# Patient Record
Sex: Female | Born: 1935 | Race: White | Hispanic: No
Health system: Southern US, Community
[De-identification: ages and names within clinical notes are randomized; demographics above are authoritative.]

---

## 1998-07-26 ENCOUNTER — Ambulatory Visit (HOSPITAL_COMMUNITY): Admission: RE | Admit: 1998-07-26 | Discharge: 1998-07-26 | Payer: Self-pay | Admitting: *Deleted

## 2000-02-04 ENCOUNTER — Ambulatory Visit (HOSPITAL_COMMUNITY): Admission: RE | Admit: 2000-02-04 | Discharge: 2000-02-04 | Payer: Self-pay | Admitting: *Deleted

## 2000-03-15 ENCOUNTER — Other Ambulatory Visit: Admission: RE | Admit: 2000-03-15 | Discharge: 2000-03-15 | Payer: Self-pay | Admitting: *Deleted

## 2000-05-14 ENCOUNTER — Encounter (INDEPENDENT_AMBULATORY_CARE_PROVIDER_SITE_OTHER): Payer: Self-pay

## 2000-05-14 ENCOUNTER — Other Ambulatory Visit: Admission: RE | Admit: 2000-05-14 | Discharge: 2000-05-14 | Payer: Self-pay | Admitting: *Deleted

## 2001-03-17 ENCOUNTER — Other Ambulatory Visit: Admission: RE | Admit: 2001-03-17 | Discharge: 2001-03-17 | Payer: Self-pay | Admitting: *Deleted

## 2001-03-22 ENCOUNTER — Encounter: Admission: RE | Admit: 2001-03-22 | Discharge: 2001-03-22 | Payer: Self-pay | Admitting: *Deleted

## 2001-08-12 ENCOUNTER — Encounter: Payer: Self-pay | Admitting: Internal Medicine

## 2001-08-12 ENCOUNTER — Encounter: Admission: RE | Admit: 2001-08-12 | Discharge: 2001-08-12 | Payer: Self-pay | Admitting: Internal Medicine

## 2003-01-26 ENCOUNTER — Encounter: Payer: Self-pay | Admitting: Orthopedic Surgery

## 2003-01-31 ENCOUNTER — Encounter: Payer: Self-pay | Admitting: Orthopedic Surgery

## 2003-01-31 ENCOUNTER — Inpatient Hospital Stay (HOSPITAL_COMMUNITY): Admission: RE | Admit: 2003-01-31 | Discharge: 2003-02-04 | Payer: Self-pay | Admitting: Orthopedic Surgery

## 2004-08-19 ENCOUNTER — Encounter: Admission: RE | Admit: 2004-08-19 | Discharge: 2004-08-19 | Payer: Self-pay | Admitting: Orthopedic Surgery

## 2004-09-03 ENCOUNTER — Encounter: Admission: RE | Admit: 2004-09-03 | Discharge: 2004-09-03 | Payer: Self-pay | Admitting: Orthopedic Surgery

## 2005-08-24 ENCOUNTER — Other Ambulatory Visit: Admission: RE | Admit: 2005-08-24 | Discharge: 2005-08-24 | Payer: Self-pay | Admitting: Internal Medicine

## 2005-11-18 ENCOUNTER — Encounter: Admission: RE | Admit: 2005-11-18 | Discharge: 2005-11-18 | Payer: Self-pay | Admitting: Family Medicine

## 2005-12-02 ENCOUNTER — Encounter: Admission: RE | Admit: 2005-12-02 | Discharge: 2005-12-02 | Payer: Self-pay | Admitting: Family Medicine

## 2005-12-10 ENCOUNTER — Ambulatory Visit: Payer: Self-pay | Admitting: Physical Medicine & Rehabilitation

## 2005-12-10 ENCOUNTER — Inpatient Hospital Stay (HOSPITAL_COMMUNITY): Admission: RE | Admit: 2005-12-10 | Discharge: 2005-12-14 | Payer: Self-pay | Admitting: Orthopedic Surgery

## 2006-03-24 ENCOUNTER — Encounter: Admission: RE | Admit: 2006-03-24 | Discharge: 2006-03-24 | Payer: Self-pay | Admitting: Family Medicine

## 2009-03-18 ENCOUNTER — Encounter: Admission: RE | Admit: 2009-03-18 | Discharge: 2009-03-18 | Payer: Self-pay | Admitting: Internal Medicine

## 2009-03-25 ENCOUNTER — Ambulatory Visit (HOSPITAL_COMMUNITY): Admission: RE | Admit: 2009-03-25 | Discharge: 2009-03-25 | Payer: Self-pay | Admitting: *Deleted

## 2009-04-02 ENCOUNTER — Ambulatory Visit (HOSPITAL_COMMUNITY): Admission: RE | Admit: 2009-04-02 | Discharge: 2009-04-02 | Payer: Self-pay | Admitting: Internal Medicine

## 2009-04-04 ENCOUNTER — Encounter: Admission: RE | Admit: 2009-04-04 | Discharge: 2009-04-04 | Payer: Self-pay | Admitting: *Deleted

## 2009-04-15 ENCOUNTER — Ambulatory Visit (HOSPITAL_COMMUNITY): Admission: RE | Admit: 2009-04-15 | Discharge: 2009-04-15 | Payer: Self-pay | Admitting: *Deleted

## 2009-05-21 ENCOUNTER — Encounter: Admission: RE | Admit: 2009-05-21 | Discharge: 2009-05-21 | Payer: Self-pay | Admitting: *Deleted

## 2010-06-03 ENCOUNTER — Encounter: Admission: RE | Admit: 2010-06-03 | Discharge: 2010-06-26 | Payer: Self-pay | Admitting: Diagnostic Neuroimaging

## 2010-09-13 IMAGING — CT CT PELVIS W/ CM
3 of 6 series · 14 of 32 positions shown, 19 images · IV contrast (30CC OMNI 350 & [ID] OMNI 300)
Comparison: None.

CT ABDOMEN

CLINICAL DATA: Diffuse abdominal pain.  Right upper quadrant pain.
Nausea and constipation.

CT ABDOMEN AND PELVIS WITH CONTRAST
TECHNIQUE: Multidetector CT imaging of the abdomen and pelvis was
performed using the standard protocol following bolus
administration of intravenous contrast.
Contrast: 100 ml [id number]

[Series 2: abdomen w/ · axial · 0.76mm/px · z∈[-290,-66]mm · 3 of 91 slices shown (1 of 2)]
[im 23/91  soft-tissue]
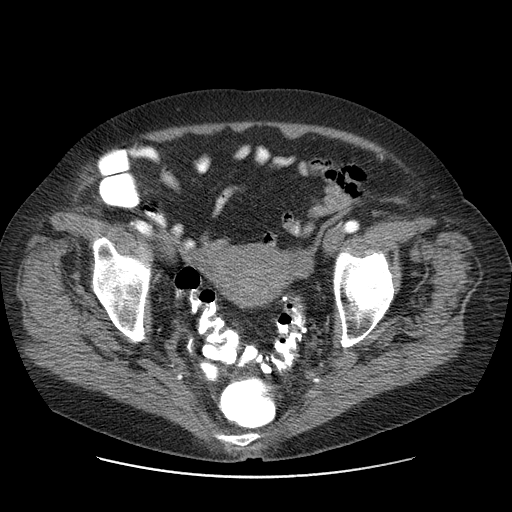
[im 46/91  soft-tissue]
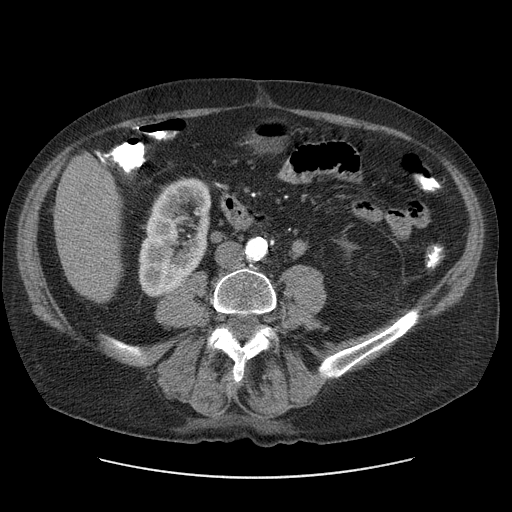
[im 68/91  soft-tissue]
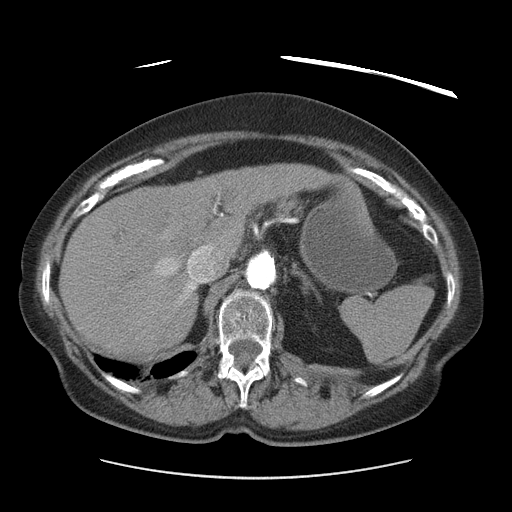

[Series 5: abdomen w/ · axial · 0.76mm/px · z∈[-316,-46]mm · 4 of 92 slices shown, 9 images (2 of 2)]
[im 19/92  soft-tissue]
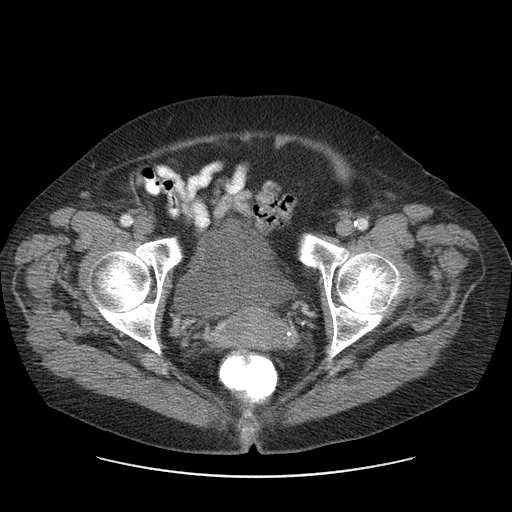
[im 19/92  lung]
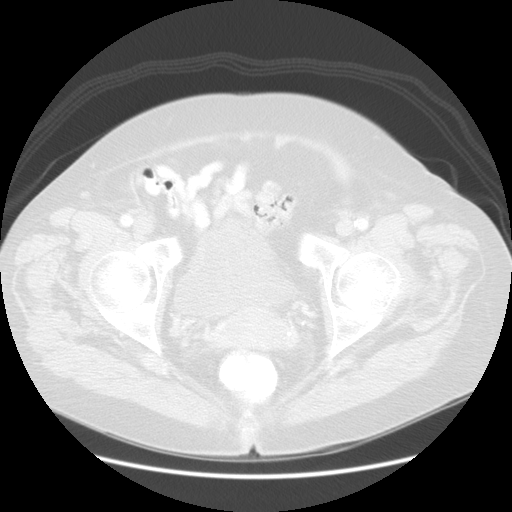
[im 19/92  bone]
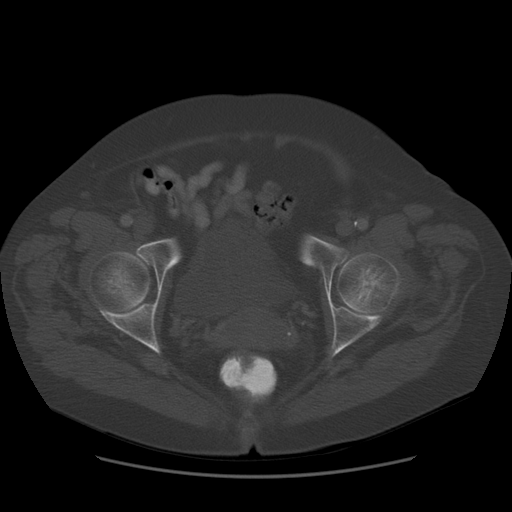
[im 37/92  soft-tissue]
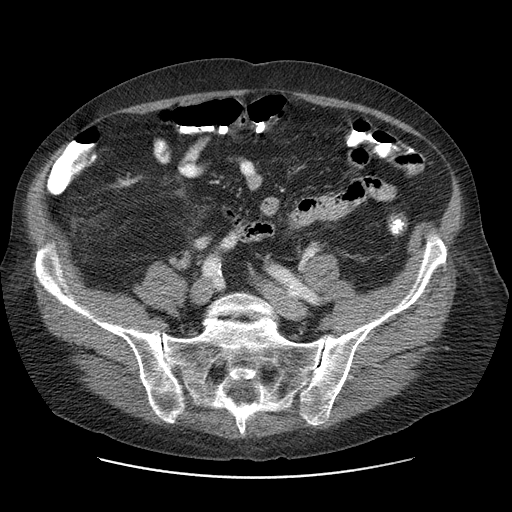
[im 37/92  lung]
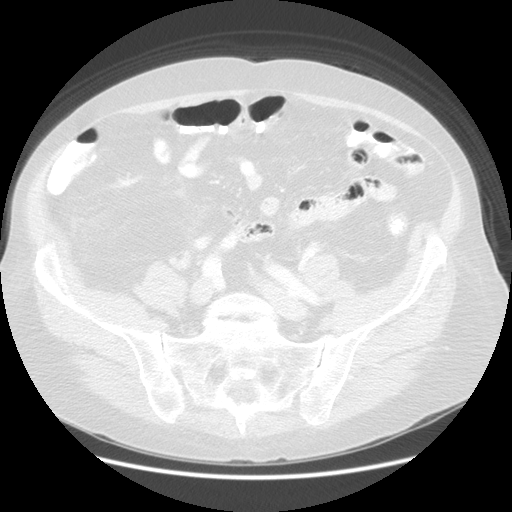
[im 55/92  soft-tissue]
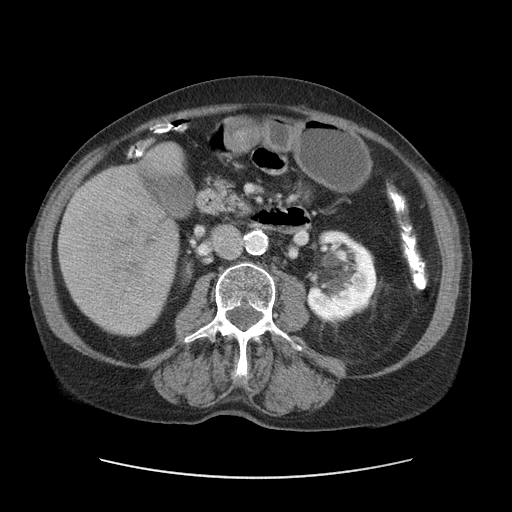
[im 55/92  lung]
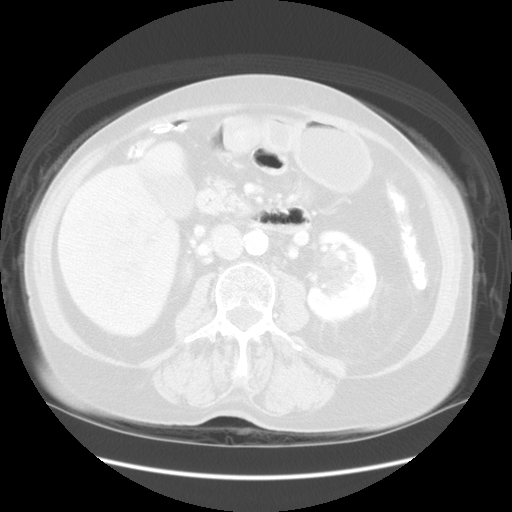
[im 73/92  soft-tissue]
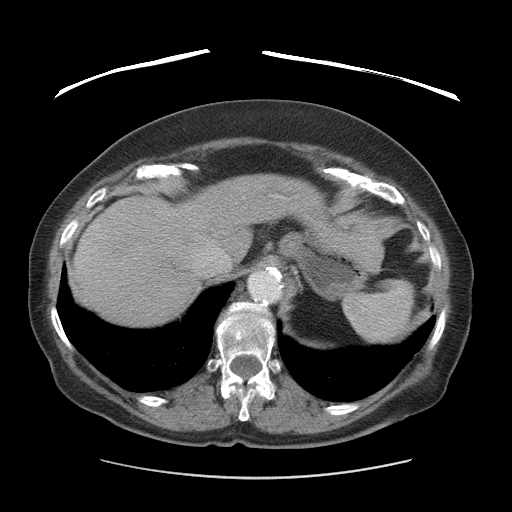
[im 73/92  lung]
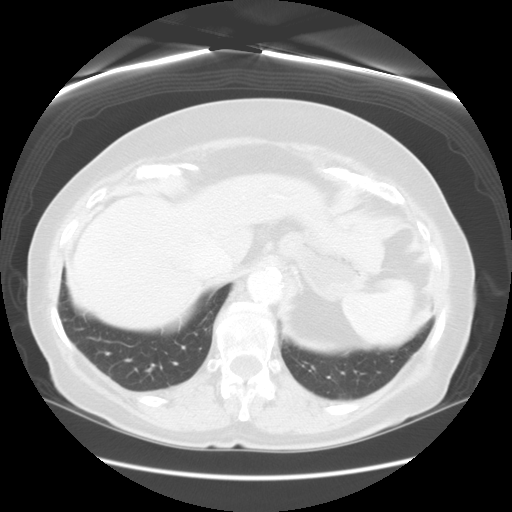

[Series 701: sagittal · sagittal · 0.94mm/px · 7 of 156 slices shown]
[im 20/156  soft-tissue]
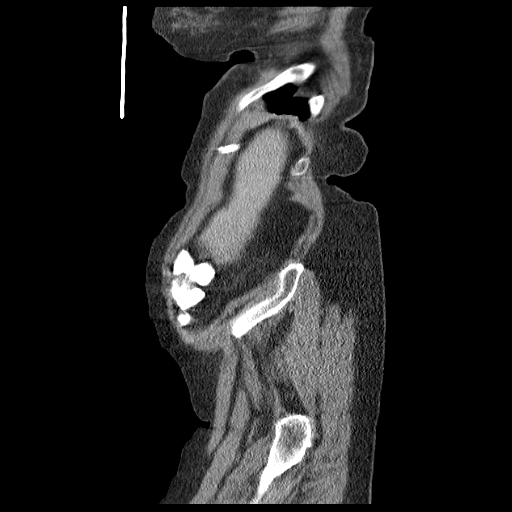
[im 39/156  soft-tissue]
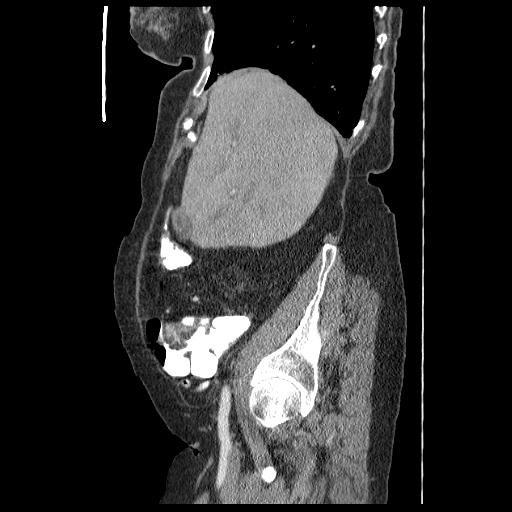
[im 59/156  soft-tissue]
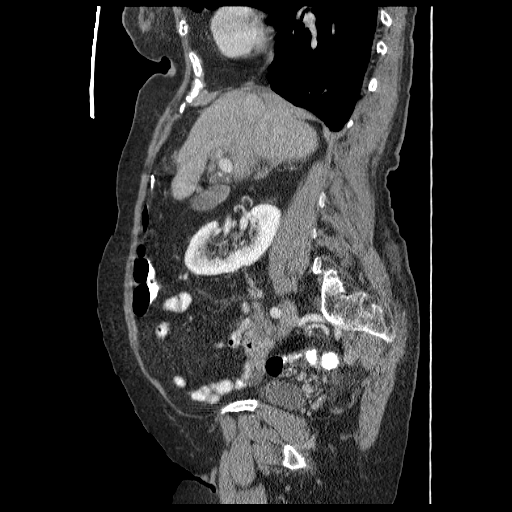
[im 78/156  soft-tissue]
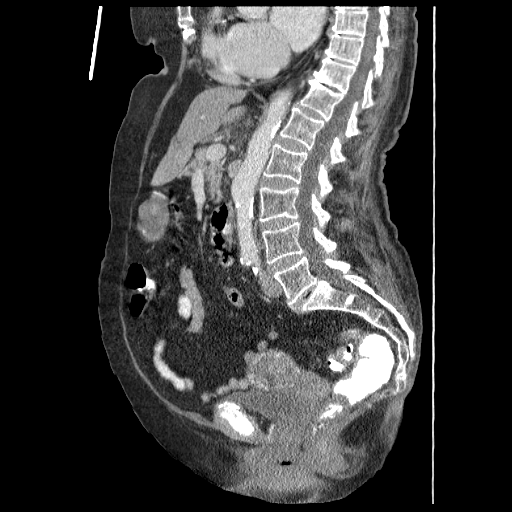
[im 97/156  soft-tissue]
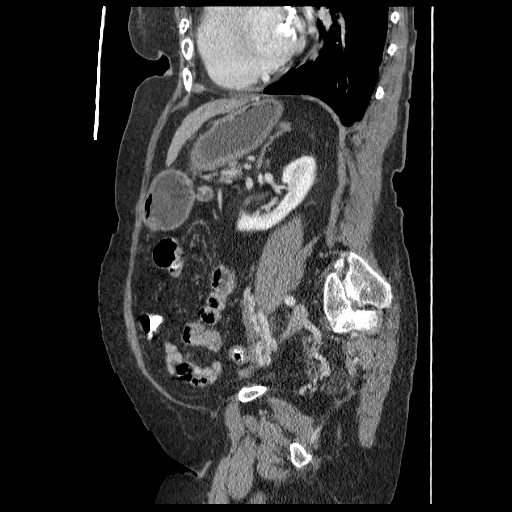
[im 117/156  soft-tissue]
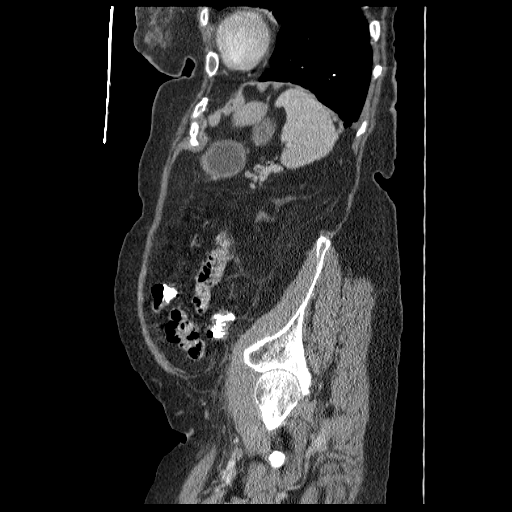
[im 136/156  soft-tissue]
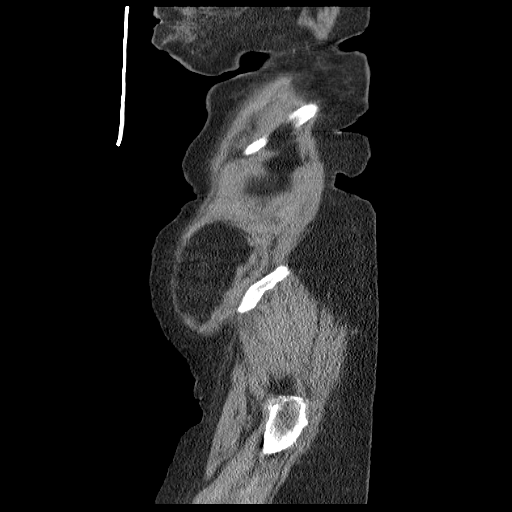

[14 of 32 positions shown; findings below may reference images not displayed]

FINDINGS: Lung bases show mild scarring.  Heart is enlarged.  No
pericardial or pleural effusion.

Mild periportal edema.  Liver, gallbladder and adrenal glands are
unremarkable.  Right kidney appears non rotated.  There are left
renal sinus cysts.  Spleen and pancreas are unremarkable.  There
may be soft tissue at the orifice of the pyloric channel (series 5,
image 41).  Small bowel is unremarkable.  Atherosclerotic
calcification of the arterial vasculature.  No pathologically
enlarged lymph nodes.
IMPRESSION: Question soft tissue at the orifice of the pyloric channel.  Upper
GI series or endoscopy may be helpful in further evaluation, as
clinically indicated.

CT PELVIS
FINDINGS: Calcified lesions in the uterus are likely fibroids.
Colon unremarkable.  No pathologically enlarged lymph nodes.  Trace
free fluid.  No worrisome lytic or sclerotic lesions.  Pars defects
are seen at L5, with minimal grade 1 anterolisthesis of L5 on S1.
IMPRESSION: No acute findings in the pelvis.

## 2010-11-22 ENCOUNTER — Encounter: Payer: Self-pay | Admitting: Orthopedic Surgery

## 2011-03-17 NOTE — Op Note (Signed)
NAME:  Sierra Robinson, Sierra Robinson              ACCOUNT NO.:  [id number]   MEDICAL RECORD NO.:  [id number]          PATIENT TYPE:  AMB   LOCATION:  ENDO                         FACILITY:  [facility]   PHYSICIAN:  Georgiana Spinner, M.D.    DATE OF BIRTH:  13-Oct-1919   DATE OF PROCEDURE:  04/15/2009  DATE OF DISCHARGE:                               OPERATIVE REPORT   PROCEDURE:  Colonoscopy.   INDICATIONS:  Abdominal pain.   ANESTHESIA:  Fentanyl 35 mcg, Versed 3 mg.   PROCEDURE:  With the patient mildly sedated in the left lateral  decubitus position, the Pentax videoscopic colonoscope was inserted into  the rectum and passed under direct vision to the cecum, identified by  ileocecal valve and appendiceal orifice, both of which were  photographed.  From this point the colonoscope was slowly withdrawn  taking circumferential views of colonic mucosa, stopping only to  photograph diverticula seen along the way in the sigmoid colon until we  reached the rectum which appeared normal on direct and showed  hemorrhoids on retroflexed view.  The colonoscope was straightened and  withdrawn.  The patient's vital signs and pulse oximeter remained  stable.  The patient tolerated the procedure well without apparent  complications.   FINDINGS:  Tortuous diverticula-filled sigmoid colon with thickened  walls.  Internal hemorrhoids were also noted.  Otherwise unremarkable  exam.   PLAN:  Have the patient follow up with me as an outpatient.           ______________________________  Georgiana Spinner, M.D.     GMO/MEDQ  D:  04/15/2009  T:  04/15/2009  Job:  [id number]

## 2011-03-17 NOTE — Op Note (Signed)
NAME:  Sierra Robinson, Sierra Robinson              ACCOUNT NO.:  [id number]   MEDICAL RECORD NO.:  [id number]          PATIENT TYPE:  AMB   LOCATION:  ENDO                         FACILITY:  [facility]   PHYSICIAN:  Georgiana Spinner, M.D.    DATE OF BIRTH:  12/25/1918   DATE OF PROCEDURE:  03/25/2009  DATE OF DISCHARGE:                               OPERATIVE REPORT   PROCEDURE:  Upper endoscopy.   INDICATIONS:  Abdominal pain.   ANESTHESIA:  Fentanyl 25 mcg, Versed 2 mg.   PROCEDURE:  With the patient mildly sedated in the left lateral  decubitus position, the Pentax videoscopic endoscope was inserted and  passed under direct vision through the esophagus which appeared normal  into the stomach.  Fundus, body, antrum was visualized and specifically  in the prepyloric area there was a question of the mass on the  radiologic studies but the prepyloric area was clearly normal.  We were  able to advance through the pylorus into the duodenal bulb, second  portion duodenum also patent viewed and appeared normal.  From this  point the endoscope was slowly withdrawn taking circumferential views of  duodenal mucosa until the endoscope had been pulled back into the  stomach placed in retroflexion to view the stomach from below.  The  endoscope was straightened and withdrawn taking circumferential views of  the remaining gastric and esophageal mucosa.  The patient's vital signs,  pulse oximeter remained stable.  The patient tolerated procedure well  without apparent complication.   FINDINGS:  Unremarkable examination possibly decreased.   PLAN:  Gastric emptying study and have patient follow-up with me as an  outpatient.           ______________________________  Georgiana Spinner, M.D.     GMO/MEDQ  D:  03/25/2009  T:  03/25/2009  Job:  [id number]

## 2011-03-20 NOTE — Discharge Summary (Signed)
NAME:  Sierra Robinson, Sierra Robinson              ACCOUNT NO.:  [id number]   MEDICAL RECORD NO.:  [id number]          PATIENT TYPE:  INP   LOCATION:  5024                         FACILITY:  [facility]   PHYSICIAN:  Loreta Ave, M.D. DATE OF BIRTH:  14-May-1949   DATE OF ADMISSION:  12/10/2005  DATE OF DISCHARGE:  12/14/2005                                 DISCHARGE SUMMARY   FINAL DIAGNOSES:  1.  Status post left total knee replacement for end-stage degenerative joint      disease.  2.  Hypertension, not otherwise specified.  3.  Esophageal reflux.  4.  Hypothyroidism.   HISTORY OF PRESENT ILLNESS:  An 77 year old white female with history of end-  stage DJD left knee, in chronic pain.  Sent to our  the office for  evaluation for total knee replacement.  She has had progressive worsening  pain with failed response with conservative treatment. Significant decreased  in daily activities due to the ongoing complaint.   HOSPITAL COURSE:  On December 10, 2005, the patient was taken to the [facility] Operating Room and a left total knee replacement procedure was  performed.  Surgeon: Loreta Ave, M.D.  Assistant: Dimple Casey, P.A.-  C.  Anesthesia was general with femoral nerve block.  No specimens.  EBL  minimal.  Tourniquet time: 90 minutes.  Hemovac drain x1 placed.  There were  no surgical or anesthesia complications, and the patient was transferred to  recovery in stable condition.   On 77 February 2007, good pain control, complained of some nausea.  No chest  pain, shortness of breath, abdominal pain.  Vital signs stable, afebrile.  Hemoglobin 10.6.  Sodium 131, glucose 130.  INR 1.1.  Dressing clean, dry,  and intact.  Calf nontender, neurovascularly intact.  Started pharmacy  protocol Coumadin.  PT and OT consult.   On 12 December 2005, patient doing well.  No complaints of nausea.  Hemoglobin 9.4.  INR 1.7.  Sodium 136.  Temperature 99, pulse 72,  respirations 20, blood pressure  154/61.  Wound looked good, staples intact.  Hemovac drain discontinued.  Calf nontender, neurovascularly intact.  Discontinued PCA and Foley and heparin locked IV.   On 13 December 2005, patient doing well and progressing.  Hemoglobin 8.7.  Sodium 136, glucose 118.  INR 2.  Vital signs stable, afebrile.  Minimal  bleeding from wound.  Staples intact.  No signs of infection.  Calf  nontender, neurovascularly intact.  Discontinued IV.   On 14 December 2005, the patient voiced no specific complaint.  Completed  hall ambulation and stairs without difficulty.  States that she is ready to  go home.  Temperature  97.6, pulse 65, respirations 18, blood pressure  140/71.  INR 1.8.  Wound looked good, staples intact. No drainage or sign of  infection.  Calf nontender and neurovascularly intact.  Patient ready for  discharge home.   DISCHARGE MEDICATIONS:  1.  Percocet 5/325 one to two tablets p.o. q. 4-6 h p.r.n. for pain.  2.  Robaxin 500 mg 1 tablet p.o. q. 6 h p.r.n. for spasm.  3.  Iron 325 mg 1 tablet p.o. twice daily with meals.  4.  Coumadin per pharmacy protocol.  5.  Resume previous home medications.   CONDITION ON DISCHARGE:  Good and stable.   DISPOSITION:  Discharge home.   DISCHARGE INSTRUCTIONS:  1.  The patient will work with home health PT and OT to improve ambulation,      knee range of motion and strengthening.  2.  Coumadin x4 weeks postoperative DVT prophylaxis.  3.  Dressing changes p.r.n.  4.  Follow up in office 2 weeks postop for recheck and possible staple      removal.  5.  Return sooner if needed.     ______________________________  Dimple Casey, P.A.-C.      Loreta Ave, M.D.  Electronically Signed    JM/MEDQ  D:  01/20/2006  T:  01/20/2006  Job:  [id number]

## 2011-03-20 NOTE — Op Note (Signed)
NAME:  Sierra Robinson, Sierra Robinson              ACCOUNT NO.:  [id number]   MEDICAL RECORD NO.:  [id number]          PATIENT TYPE:  INP   LOCATION:  5024                         FACILITY:  [facility]   PHYSICIAN:  Loreta Ave, M.D. DATE OF BIRTH:  June 08, 1919   DATE OF PROCEDURE:  12/10/2005  DATE OF DISCHARGE:                                 OPERATIVE REPORT   PREOPERATIVE DIAGNOSIS:  End-stage degenerative arthritis, left knee, valgus  alignment.   POSTOPERATIVE DIAGNOSIS:  End-stage degenerative arthritis, left knee,  valgus alignment.   OPERATIVE PROCEDURE:  Left total knee replacement.  Minimally-invasive  system.  [employer] prosthesis.  Triathlon.  Cemented posterior-  stabilized peg, #4 femoral component.  Cemented #4 tibial component with a 9  mm posterior-stabilized polyethylene insert.  Cemented resurfacing  medialized 32 x 10 mm patellar component.   SURGEON:  Loreta Ave, M.D.   ASSISTANT:  Genene Churn. Denton Meek.   ANESTHESIA:  General.   ESTIMATED BLOOD LOSS:  Minimal.   TOURNIQUET TIME:  1 hour 30 minutes.   SPECIMENS:  Excised bone and soft tissue.   CULTURES:  None.   COMPLICATIONS:  None.   DRESSING:  Soft compressive, knee immobilizer.   DRAINS:  Hemovac x1.   PROCEDURE:  Patient brought to the operating room and after adequate  anesthesia had been obtained, left knee examined.  Increased valgus,  correctable to about 5 degrees.  Stable ligaments.  Almost full extension,  flexion better than 100 degrees.  Tourniquet applied, prepped and draped in  the usual sterile fashion.  Exsanguinated with elevation and an Esmarch and  tourniquet inflated to 350 mmHg.  A straight incision above the patella down  to the tibial tubercle.  Skin and subcutaneous tissue divided.  Medial  arthrotomy from the tibia up to the superior medial margin of the patella  and then a vastus-splitting incision for a minimally-invasive system.  Knee  exposed.  Grade 4 changes  throughout.  Remnants of menisci and cruciate  ligaments, periarticular spurs and loose bodies removed.  Distal femur  exposed.  An intramedullary guide placed.  A 10 mm resection set at 5  degrees of valgus.  Sized for a #4 component.  Matched the epicondylar axis.  The jig was put in place for a #4 component, definitive cuts made.  Trial  put in place and found to fit well.  Trials removed and attention turned to  the tibia.  Extramedullary guide.  Proximal cut, 3 degree posterior slope,  removing 6 mm off the medial side.  Moderately osteopenic throughout.  Appropriate resection of the tibia, protecting collateral and posterior  structures.  Sized for a #4 component.  Trials put in place, #4 on the  tibia, #4 on the femur.  With a 9 mm insert and after balancing the knee  with appropriate resections, I had full extension, full flexion, a nicely  balanced knee with good stability in flexion and extension.  The patella was  exposed and the posterior 10 mm was resected with a saw.  I sized and then  predrilled for the 32 mm medial-offset  x 10 mm patellar component.  Trial  put in place.  Excellent patellofemoral tracking through full motion.  The  tibia was marked for appropriate rotation and then hand-reamed.  All trials  were removed.  The wound was copiously irrigated with the pulse irrigating  device.  All recesses examined to be sure all loose fragments are removed.  Cement prepared and placed on all components, which were then firmly seated  removing excessive cement.  Polyethylene attached to the tibia.  The knee  reduced.  After cement hardened, the knee was reexamined.  Full extension,  full flexion, good alignment, good stability, good patellofemoral tracking.  The wound irrigated.  A Hemovac placed and brought out through a separate  stab wound.  Arthrotomy closed with #1 Vicryl, skin and subcutaneous tissue  with Vicryl and staples.  Knee injected with Marcaine.  A sterile   compressive dressing applied.  Tourniquet was deflated and removed.  Knee  immobilizer applied.  Anesthesia reversed.  Brought to the recovery room.  Tolerated the surgery well with no complications.      Loreta Ave, M.D.  Electronically Signed     DFM/MEDQ  D:  12/10/2005  T:  12/10/2005  Job:  [id number]

## 2011-03-20 NOTE — Op Note (Signed)
NAME:  Sierra Robinson, Sierra Robinson                        ACCOUNT NO.:  [id number]   MEDICAL RECORD NO.:  [id number]                   PATIENT TYPE:  INP   LOCATION:  5016                                 FACILITY:  [facility]   PHYSICIAN:  Loreta Ave, M.D.              DATE OF BIRTH:  Dec 11, 1918   DATE OF PROCEDURE:  01/31/2003  DATE OF DISCHARGE:                                 OPERATIVE REPORT   PREOPERATIVE DIAGNOSIS:  End-stage degenerative arthritis, right knee, with  marked valgus alignment and bony erosion, lateral compartment.   POSTOPERATIVE DIAGNOSIS:  End-stage degenerative arthritis, right knee, with  marked valgus alignment and bony erosion, lateral compartment.   PROCEDURE:  Right total knee replacement with appropriate soft tissue  balancing, including lateral retinacular release.   COMPONENTS:  Cemented posterior-stabilized #7 femoral component.  Cemented  #9 tibial component with 12 mm PS Flex insert.  Cemented, recessed 26 mm  patellar component.   SURGEON:  Loreta Ave, M.D.   ASSISTANT:  Arlys John D. Petrarca, Robinson.A.-C.   ESTIMATED BLOOD LOSS:  Minimal.   TOURNIQUET TIME:  1 hour 20 minutes.   SPECIMENS:  Excised bone and soft tissue.   CULTURES:  None.   COMPLICATIONS:  None.   DRESSING:  Soft compressive with knee immobilizer.   DRAINS:  Hemovac x2.   DESCRIPTION OF PROCEDURE:  Patient brought to the operating room and placed  on the operating table in the supine position.  After adequate anesthesia  had been obtained, right knee examined.  More than 10 degrees of valgus,  correctable to a little under 10 degrees.  Pseudolaxity of collaterals, but  these were intact and functional.  Lateral patellofemoral tracking and  tethering.  Slight hyperextension, flexion to 110 degrees.  Tourniquet  applied, prepped and draped in the usual sterile fashion.  Exsanguinated  with elevation and Esmarch, tourniquet inflated to 350 mmHg.  A straight  incision above the  patella down to the tibial tubercle.  A medial  parapatellar arthrotomy, hemostasis with cautery.  The knee exposed.  Marked  grade 4 changes throughout.  A partial capsular release laterally to get a  balanced knee, although this was not extensive as she could be somewhat  corrected.  Periarticular spurs, remnants of menisci all resected  throughout.  Distal femur exposed.  Intramedullary guide placed.  Distal cut  at 5 degrees of valgus.  Sized for a #7 component, jigs put in place,  definitive cuts made.  Proximal tibia exposed.  Tibial spine removed with a  saw.  Sized for a #9 component.  Proximal cut 5 degree posterior slope,  removing 4-5 mm from the deficient size.  Patella sized, reamed, and drilled  for a 26 mm component.  All trials put in place and the tibia marked for  appropriate rotation and hand-reamed.  With the 12 mm insert I had a nicely  balanced knee with full extension,  full flexion, no lift-off.  A lateral  release was necessary to balance the patellofemoral joint and after this,  she had good patellofemoral tracking.  All trials removed.  The wound  irrigated with the pulse irrigating device.  Cement prepared and placed on  all components.  Tibial component seated, polyethylene attached, femoral  component seated.  All excessive cement removed.  Patellar component  cemented in place and excessive cement removed.  Once the cement had  hardened, the knee was re-examined.  Full extension, full flexion, nicely  balanced knee at 5 degrees of valgus.  Good patellofemoral tracking after  lateral release.  Wound irrigated.  Hemovac placed, brought out through a  separate stab wound.  The arthrotomy closed with #1 Vicryl, skin and  subcutaneous tissue with Vicryl with staples.  Margins of the wound and knee  injected with Marcaine.  Sterile compressive dressing applied.  Tourniquet  deflated and removed.  Knee immobilizer applied.  Anesthesia reversed.  Brought to the  recovery room.  Tolerated the surgery well with no  complications.                                               Loreta Ave, M.D.    DFM/MEDQ  D:  02/01/2003  T:  02/01/2003  Job:  [id number]

## 2011-03-20 NOTE — Op Note (Signed)
   NAME:  Sierra Robinson, Sierra Robinson                        ACCOUNT NO.:  [id number]   MEDICAL RECORD NO.:  [id number]                   PATIENT TYPE:  INP   LOCATION:  2895                                 FACILITY:  [facility]   PHYSICIAN:  Bedelia Person, M.D.                     DATE OF BIRTH:  Jul 06, 1919   DATE OF PROCEDURE:  01/31/2003  DATE OF DISCHARGE:                                 OPERATIVE REPORT   PROCEDURE:  Epidural catheter placement for postoperative epidural fentanyl  pain control.   DESCRIPTION OF PROCEDURE:  Prior to the surgical procedure, the risks and  benefits of postop epidural fentanyl pain control were discussed with the  patient.  These included but were not limited to the risks of backache,  headache, nerve damage, paralysis, allergic reaction to the anesthetic, and  the more common reactions of nausea and vomiting and itching.  The patient  elected to proceed with the technique after all questions were answered and  she discussed the procedure with her primary surgeon, Loreta Ave, M.D.  After the dressing was applied, the patient was turned to the full right  lateral decubitus position, Betadine prep x3 was performed on the lumbar  region, sterile technique was used.  A 17-gauge Tuohy needle was inserted in  a midline approach at the L3-4 interspace.  Using air loss of resistance,  the epidural space was located on the first attempt.  There was no blood or  CSF noted.  A nonstyletted catheter was then placed 6 cm into the epidural  space and the needle was withdrawn.  There was again a negative aspiration  for blood or CSF through the catheter.  A mixture of preservative-free  Xylocaine 1% 5 mL, 0.9 normal saline 10 mL, and 100 mcg of fentanyl was  slowly injected into the catheter with frequent negative aspirations.  The  catheter was secured with adhesive to the patient's back using Hypafix  dressing followed by a sterile 2 x 2 at the insertion site.  The patient  was  then returned to a supine position, awoken, extubated, and taken to the  recovery room in good condition, where she was placed on a continuous  infusion of 5 mcg/mL of epidural fentanyl and 1/16% Marcaine at the initial  rate of 12 mL/hr.  She will be evaluated for the next 48 hours for adequacy  of pain control until anticoagulation has been established.  There were no  complications.                                               Bedelia Person, M.D.    LK/MEDQ  D:  01/31/2003  T:  02/01/2003  Job:  [id number]

## 2011-03-20 NOTE — Discharge Summary (Signed)
NAME:  Sierra Robinson, Sierra Robinson                        ACCOUNT NO.:  [id number]   MEDICAL RECORD NO.:  [id number]                   PATIENT TYPE:  INP   LOCATION:  5016                                 FACILITY:  [facility]   PHYSICIAN:  Loreta Ave, M.D.              DATE OF BIRTH:  07/04/1919   DATE OF ADMISSION:  01/31/2003  DATE OF DISCHARGE:  02/04/2003                                 DISCHARGE SUMMARY   ADMISSION DIAGNOSIS:  Advanced degenerative joint disease of the right knee.   DISCHARGE DIAGNOSIS:  1. Advanced degenerative joint disease of the right knee.  2. Hypokalemia.   PROCEDURE:  Right total knee replacement.   HISTORY:  A 77 year old female with previously documented DJD right knee on  arthroscopy.  She has been tried with conservative treatment including  Synvisc injections.  She has failed conservative treatment.  She is now  indicated for right total knee replacement.   HOSPITAL COURSE:  An 77 year old female admitted on January 31, 2003 after  appropriate laboratory studies were obtained as well as 1 gram vancomycin IV  on-call to the operating room.  She was taken to the operating room where  she underwent a right total knee replacement.  She tolerated the procedure  well.  She was continued postoperatively on vancomycin 1 gram IV q.12h. for  3 doses.  Heparin 5000 units subcu q.12h. was begun until her Coumadin  became therapeutic.  Consultations with PT, OT, and Rehab were made.  Ambulation/weightbearing as tolerated on the right.  CPM 0-30 degrees 8  hours per day, increasing daily by 10 degrees.  Foley was placed  intraoperatively.  She was allowed out of bed to chair the following day.  She did have some difficulty with nausea and was given Zofran 5 mg IV on  February 01, 2003.  Her epidural was discontinued on February 02, 2003 as well as  her Foley.  Her IV was held HEP-locked.  The remainder of her hospital  course was uneventful.  She improved with her therapy and  her pain.   She was discharged in improved condition after 40 mEq of K-Dur was given for  mild hypokalemia.  She was discharged on February 04, 2003 and will return back  to the office in followup.   EKG revealed marked sinus bradycardia with first-degree AV block, left axis  deviation, pulmonary disease pattern, moderate voltage criteria for LVH; may  be a normal variant.   RADIOGRAPHIC STUDIES:  Right knee of January 31, 2003, revealed normal  alignment following total right knee replacement.   LABORATORY STUDIES:  Admitted with a hemoglobin of 14.2, hematocrit 41.6%,  white count 7300, platelets 216,000.  Discharge:  Hemoglobin 8.3, hematocrit  24.6%.  Discharge pro-time was 22.9 with an INR of 2.3.  Preop sodium 138,  potassium 4.5, chloride 108, CO2 of 25, glucose 99, BUN 9, creatinine 0.9,  calcium 9.7, total protein 7.4,  albumin 4.1, AST 18, ALT 16, ALP 40.  Total  bilirubin 1.0.  Discharge sodium 133, potassium 3.1, chloride 104, CO2 of  24, glucose 112, BUN 6, creatinine 0.8, calcium 7.6; there was marked  deviation from the day before and maybe secondary to lab error.  Urinalysis  revealed 3-6 white cells, no red cells, and many bacteria.  Blood type was B  positive.  Antibody screen negative.   DISCHARGE INSTRUCTIONS:  Given a prescription for Percocet 5/325 one to two  tabs every 4 hours as needed for pain, Coumadin 5 mg as directed.  Provera  2.5 mg daily.  Tenormin 50 mg daily.  Ogen 0.625 mg daily, Levothroid 0.150  one tablet daily.  Iron 325 mg 1 tablet b.i.d.  Colace 100 mg b.i.d.  Senokot 2 tabs before dinner.   ACTIVITY:  As taught in Physical Therapy.   No restriction of diet.  Keep the wound clean and dry.  Call for an  appointment with Dr. Eulah Pont on February 14, 2003.       Oris Drone Petrarca, Robinson.A.-C.                Loreta Ave, M.D.    BDP/MEDQ  D:  04/20/2003  T:  04/23/2003  Job:  [id number]

## 2011-09-16 ENCOUNTER — Ambulatory Visit (INDEPENDENT_AMBULATORY_CARE_PROVIDER_SITE_OTHER): Payer: Medicare Other | Admitting: Ophthalmology

## 2011-09-16 DIAGNOSIS — H43819 Vitreous degeneration, unspecified eye: Secondary | ICD-10-CM

## 2011-09-16 DIAGNOSIS — H3530 Unspecified macular degeneration: Secondary | ICD-10-CM

## 2012-03-15 ENCOUNTER — Ambulatory Visit (INDEPENDENT_AMBULATORY_CARE_PROVIDER_SITE_OTHER): Payer: Medicare Other | Admitting: Ophthalmology

## 2012-03-15 DIAGNOSIS — H35039 Hypertensive retinopathy, unspecified eye: Secondary | ICD-10-CM

## 2012-03-15 DIAGNOSIS — D3130 Benign neoplasm of unspecified choroid: Secondary | ICD-10-CM

## 2012-03-15 DIAGNOSIS — I10 Essential (primary) hypertension: Secondary | ICD-10-CM

## 2012-03-15 DIAGNOSIS — H3530 Unspecified macular degeneration: Secondary | ICD-10-CM

## 2012-03-15 DIAGNOSIS — H43819 Vitreous degeneration, unspecified eye: Secondary | ICD-10-CM

## 2012-05-19 ENCOUNTER — Other Ambulatory Visit: Payer: Self-pay | Admitting: Internal Medicine

## 2012-05-19 DIAGNOSIS — R634 Abnormal weight loss: Secondary | ICD-10-CM

## 2012-05-19 DIAGNOSIS — R109 Unspecified abdominal pain: Secondary | ICD-10-CM

## 2012-05-20 ENCOUNTER — Ambulatory Visit
Admission: RE | Admit: 2012-05-20 | Discharge: 2012-05-20 | Disposition: A | Discharge status: Home / Self Care | Payer: Medicare Other | Source: Ambulatory Visit | Attending: Internal Medicine | Admitting: Internal Medicine

## 2012-05-20 DIAGNOSIS — R109 Unspecified abdominal pain: Secondary | ICD-10-CM

## 2012-05-20 DIAGNOSIS — R634 Abnormal weight loss: Secondary | ICD-10-CM

## 2012-05-20 MED ORDER — IOHEXOL 300 MG/ML  SOLN
100.0000 mL | Freq: Once | INTRAMUSCULAR | Status: AC | PRN
  Administered 2012-05-20: 100 mL via INTRAVENOUS

## 2012-09-19 ENCOUNTER — Ambulatory Visit (INDEPENDENT_AMBULATORY_CARE_PROVIDER_SITE_OTHER): Payer: Medicare Other | Admitting: Ophthalmology

## 2012-09-19 DIAGNOSIS — H35039 Hypertensive retinopathy, unspecified eye: Secondary | ICD-10-CM

## 2012-09-19 DIAGNOSIS — I10 Essential (primary) hypertension: Secondary | ICD-10-CM

## 2012-09-19 DIAGNOSIS — D3130 Benign neoplasm of unspecified choroid: Secondary | ICD-10-CM

## 2012-09-19 DIAGNOSIS — H3530 Unspecified macular degeneration: Secondary | ICD-10-CM

## 2012-09-19 DIAGNOSIS — H43819 Vitreous degeneration, unspecified eye: Secondary | ICD-10-CM

## 2013-03-22 ENCOUNTER — Ambulatory Visit (INDEPENDENT_AMBULATORY_CARE_PROVIDER_SITE_OTHER): Payer: Medicare Other | Admitting: Ophthalmology

## 2013-03-22 DIAGNOSIS — H43819 Vitreous degeneration, unspecified eye: Secondary | ICD-10-CM

## 2013-03-22 DIAGNOSIS — I10 Essential (primary) hypertension: Secondary | ICD-10-CM

## 2013-03-22 DIAGNOSIS — H35039 Hypertensive retinopathy, unspecified eye: Secondary | ICD-10-CM

## 2013-03-22 DIAGNOSIS — H3530 Unspecified macular degeneration: Secondary | ICD-10-CM

## 2013-03-22 DIAGNOSIS — D3130 Benign neoplasm of unspecified choroid: Secondary | ICD-10-CM

## 2013-03-29 ENCOUNTER — Encounter (HOSPITAL_COMMUNITY)
Admission: RE | Admit: 2013-03-29 | Discharge: 2013-03-29 | Disposition: A | Discharge status: Home / Self Care | Payer: Medicare Other | Source: Ambulatory Visit | Attending: Internal Medicine | Admitting: Internal Medicine

## 2013-03-29 DIAGNOSIS — M810 Age-related osteoporosis without current pathological fracture: Secondary | ICD-10-CM | POA: Insufficient documentation

## 2013-03-29 MED ORDER — DENOSUMAB 60 MG/ML ~~LOC~~ SOLN
60.0000 mg | Freq: Once | SUBCUTANEOUS | Status: AC
  Administered 2013-03-29: 60 mg via SUBCUTANEOUS
  Filled 2013-03-29: qty 1

## 2013-04-19 ENCOUNTER — Encounter (INDEPENDENT_AMBULATORY_CARE_PROVIDER_SITE_OTHER): Payer: Medicare Other | Admitting: Ophthalmology

## 2013-04-19 DIAGNOSIS — H35039 Hypertensive retinopathy, unspecified eye: Secondary | ICD-10-CM

## 2013-04-19 DIAGNOSIS — H353290 Exudative age-related macular degeneration, unspecified eye, stage unspecified: Secondary | ICD-10-CM

## 2013-04-19 DIAGNOSIS — D3130 Benign neoplasm of unspecified choroid: Secondary | ICD-10-CM

## 2013-04-19 DIAGNOSIS — H3530 Unspecified macular degeneration: Secondary | ICD-10-CM

## 2013-04-19 DIAGNOSIS — H43819 Vitreous degeneration, unspecified eye: Secondary | ICD-10-CM

## 2013-04-19 DIAGNOSIS — I10 Essential (primary) hypertension: Secondary | ICD-10-CM

## 2013-05-09 ENCOUNTER — Other Ambulatory Visit: Payer: Self-pay | Admitting: Orthopaedic Surgery

## 2013-05-09 DIAGNOSIS — M545 Low back pain: Secondary | ICD-10-CM

## 2013-05-16 ENCOUNTER — Ambulatory Visit
Admission: RE | Admit: 2013-05-16 | Discharge: 2013-05-16 | Disposition: A | Discharge status: Home / Self Care | Payer: Medicare Other | Source: Ambulatory Visit | Attending: Orthopaedic Surgery | Admitting: Orthopaedic Surgery

## 2013-05-16 DIAGNOSIS — M545 Low back pain: Secondary | ICD-10-CM

## 2013-05-24 ENCOUNTER — Encounter (INDEPENDENT_AMBULATORY_CARE_PROVIDER_SITE_OTHER): Payer: Medicare Other | Admitting: Ophthalmology

## 2013-05-24 DIAGNOSIS — I10 Essential (primary) hypertension: Secondary | ICD-10-CM

## 2013-05-24 DIAGNOSIS — D3130 Benign neoplasm of unspecified choroid: Secondary | ICD-10-CM

## 2013-05-24 DIAGNOSIS — H35039 Hypertensive retinopathy, unspecified eye: Secondary | ICD-10-CM

## 2013-05-24 DIAGNOSIS — H353290 Exudative age-related macular degeneration, unspecified eye, stage unspecified: Secondary | ICD-10-CM

## 2013-05-24 DIAGNOSIS — H3530 Unspecified macular degeneration: Secondary | ICD-10-CM

## 2013-05-24 DIAGNOSIS — H43819 Vitreous degeneration, unspecified eye: Secondary | ICD-10-CM

## 2013-07-05 ENCOUNTER — Encounter (INDEPENDENT_AMBULATORY_CARE_PROVIDER_SITE_OTHER): Payer: Medicare Other | Admitting: Ophthalmology

## 2013-07-05 DIAGNOSIS — D3130 Benign neoplasm of unspecified choroid: Secondary | ICD-10-CM

## 2013-07-05 DIAGNOSIS — H353290 Exudative age-related macular degeneration, unspecified eye, stage unspecified: Secondary | ICD-10-CM

## 2013-07-05 DIAGNOSIS — H35039 Hypertensive retinopathy, unspecified eye: Secondary | ICD-10-CM

## 2013-07-05 DIAGNOSIS — H43819 Vitreous degeneration, unspecified eye: Secondary | ICD-10-CM

## 2013-07-05 DIAGNOSIS — I10 Essential (primary) hypertension: Secondary | ICD-10-CM

## 2013-07-05 DIAGNOSIS — H3530 Unspecified macular degeneration: Secondary | ICD-10-CM

## 2013-08-30 ENCOUNTER — Encounter (INDEPENDENT_AMBULATORY_CARE_PROVIDER_SITE_OTHER): Payer: Medicare Other | Admitting: Ophthalmology

## 2013-08-30 DIAGNOSIS — H353290 Exudative age-related macular degeneration, unspecified eye, stage unspecified: Secondary | ICD-10-CM

## 2013-08-30 DIAGNOSIS — H3530 Unspecified macular degeneration: Secondary | ICD-10-CM

## 2013-08-30 DIAGNOSIS — H35039 Hypertensive retinopathy, unspecified eye: Secondary | ICD-10-CM

## 2013-08-30 DIAGNOSIS — I10 Essential (primary) hypertension: Secondary | ICD-10-CM

## 2013-08-30 DIAGNOSIS — H43819 Vitreous degeneration, unspecified eye: Secondary | ICD-10-CM

## 2013-08-30 DIAGNOSIS — D3130 Benign neoplasm of unspecified choroid: Secondary | ICD-10-CM

## 2013-10-18 ENCOUNTER — Encounter (INDEPENDENT_AMBULATORY_CARE_PROVIDER_SITE_OTHER): Payer: Medicare Other | Admitting: Ophthalmology

## 2013-10-18 DIAGNOSIS — H35039 Hypertensive retinopathy, unspecified eye: Secondary | ICD-10-CM

## 2013-10-18 DIAGNOSIS — H3530 Unspecified macular degeneration: Secondary | ICD-10-CM

## 2013-10-18 DIAGNOSIS — I10 Essential (primary) hypertension: Secondary | ICD-10-CM

## 2013-10-18 DIAGNOSIS — H353290 Exudative age-related macular degeneration, unspecified eye, stage unspecified: Secondary | ICD-10-CM

## 2013-10-18 DIAGNOSIS — D3130 Benign neoplasm of unspecified choroid: Secondary | ICD-10-CM

## 2013-12-13 ENCOUNTER — Encounter (INDEPENDENT_AMBULATORY_CARE_PROVIDER_SITE_OTHER): Payer: Medicare HMO | Admitting: Ophthalmology

## 2013-12-13 DIAGNOSIS — D3130 Benign neoplasm of unspecified choroid: Secondary | ICD-10-CM

## 2013-12-13 DIAGNOSIS — I10 Essential (primary) hypertension: Secondary | ICD-10-CM

## 2013-12-13 DIAGNOSIS — H353290 Exudative age-related macular degeneration, unspecified eye, stage unspecified: Secondary | ICD-10-CM

## 2013-12-13 DIAGNOSIS — H3530 Unspecified macular degeneration: Secondary | ICD-10-CM

## 2013-12-13 DIAGNOSIS — H43819 Vitreous degeneration, unspecified eye: Secondary | ICD-10-CM

## 2013-12-13 DIAGNOSIS — H35039 Hypertensive retinopathy, unspecified eye: Secondary | ICD-10-CM

## 2014-01-31 ENCOUNTER — Encounter (INDEPENDENT_AMBULATORY_CARE_PROVIDER_SITE_OTHER): Payer: Medicare HMO | Admitting: Ophthalmology

## 2014-01-31 DIAGNOSIS — H35039 Hypertensive retinopathy, unspecified eye: Secondary | ICD-10-CM

## 2014-01-31 DIAGNOSIS — I10 Essential (primary) hypertension: Secondary | ICD-10-CM

## 2014-01-31 DIAGNOSIS — H43819 Vitreous degeneration, unspecified eye: Secondary | ICD-10-CM

## 2014-01-31 DIAGNOSIS — H353290 Exudative age-related macular degeneration, unspecified eye, stage unspecified: Secondary | ICD-10-CM

## 2014-01-31 DIAGNOSIS — D3130 Benign neoplasm of unspecified choroid: Secondary | ICD-10-CM

## 2014-01-31 DIAGNOSIS — H3530 Unspecified macular degeneration: Secondary | ICD-10-CM

## 2014-03-01 ENCOUNTER — Other Ambulatory Visit: Payer: Self-pay | Admitting: Internal Medicine

## 2014-03-01 DIAGNOSIS — R221 Localized swelling, mass and lump, neck: Secondary | ICD-10-CM

## 2014-03-05 ENCOUNTER — Ambulatory Visit
Admission: RE | Admit: 2014-03-05 | Discharge: 2014-03-05 | Disposition: A | Discharge status: Home / Self Care | Payer: Commercial Managed Care - HMO | Source: Ambulatory Visit | Attending: Internal Medicine | Admitting: Internal Medicine

## 2014-03-05 DIAGNOSIS — R221 Localized swelling, mass and lump, neck: Secondary | ICD-10-CM

## 2014-03-21 ENCOUNTER — Encounter (INDEPENDENT_AMBULATORY_CARE_PROVIDER_SITE_OTHER): Payer: Medicare HMO | Admitting: Ophthalmology

## 2014-03-21 DIAGNOSIS — H3530 Unspecified macular degeneration: Secondary | ICD-10-CM

## 2014-03-21 DIAGNOSIS — I10 Essential (primary) hypertension: Secondary | ICD-10-CM

## 2014-03-21 DIAGNOSIS — H43819 Vitreous degeneration, unspecified eye: Secondary | ICD-10-CM

## 2014-03-21 DIAGNOSIS — D3130 Benign neoplasm of unspecified choroid: Secondary | ICD-10-CM

## 2014-03-21 DIAGNOSIS — H35039 Hypertensive retinopathy, unspecified eye: Secondary | ICD-10-CM

## 2014-03-21 DIAGNOSIS — H353290 Exudative age-related macular degeneration, unspecified eye, stage unspecified: Secondary | ICD-10-CM

## 2014-04-25 ENCOUNTER — Encounter (INDEPENDENT_AMBULATORY_CARE_PROVIDER_SITE_OTHER): Payer: Commercial Managed Care - HMO | Admitting: Ophthalmology

## 2014-04-25 DIAGNOSIS — D3130 Benign neoplasm of unspecified choroid: Secondary | ICD-10-CM | POA: Diagnosis not present

## 2014-04-25 DIAGNOSIS — H43819 Vitreous degeneration, unspecified eye: Secondary | ICD-10-CM | POA: Diagnosis not present

## 2014-04-25 DIAGNOSIS — H353290 Exudative age-related macular degeneration, unspecified eye, stage unspecified: Secondary | ICD-10-CM | POA: Diagnosis not present

## 2014-04-25 DIAGNOSIS — I10 Essential (primary) hypertension: Secondary | ICD-10-CM | POA: Diagnosis not present

## 2014-04-25 DIAGNOSIS — H35039 Hypertensive retinopathy, unspecified eye: Secondary | ICD-10-CM | POA: Diagnosis not present

## 2014-04-25 DIAGNOSIS — H3530 Unspecified macular degeneration: Secondary | ICD-10-CM | POA: Diagnosis not present

## 2014-05-23 ENCOUNTER — Encounter (INDEPENDENT_AMBULATORY_CARE_PROVIDER_SITE_OTHER): Payer: Commercial Managed Care - HMO | Admitting: Ophthalmology

## 2014-05-23 DIAGNOSIS — I10 Essential (primary) hypertension: Secondary | ICD-10-CM

## 2014-05-23 DIAGNOSIS — D3130 Benign neoplasm of unspecified choroid: Secondary | ICD-10-CM

## 2014-05-23 DIAGNOSIS — H43819 Vitreous degeneration, unspecified eye: Secondary | ICD-10-CM

## 2014-05-23 DIAGNOSIS — H35039 Hypertensive retinopathy, unspecified eye: Secondary | ICD-10-CM

## 2014-05-23 DIAGNOSIS — H353290 Exudative age-related macular degeneration, unspecified eye, stage unspecified: Secondary | ICD-10-CM

## 2014-07-04 ENCOUNTER — Encounter (INDEPENDENT_AMBULATORY_CARE_PROVIDER_SITE_OTHER): Payer: Commercial Managed Care - HMO | Admitting: Ophthalmology

## 2014-07-04 DIAGNOSIS — I10 Essential (primary) hypertension: Secondary | ICD-10-CM

## 2014-07-04 DIAGNOSIS — H353290 Exudative age-related macular degeneration, unspecified eye, stage unspecified: Secondary | ICD-10-CM

## 2014-07-04 DIAGNOSIS — H43819 Vitreous degeneration, unspecified eye: Secondary | ICD-10-CM

## 2014-07-04 DIAGNOSIS — D3130 Benign neoplasm of unspecified choroid: Secondary | ICD-10-CM

## 2014-07-04 DIAGNOSIS — H35039 Hypertensive retinopathy, unspecified eye: Secondary | ICD-10-CM

## 2014-08-15 ENCOUNTER — Encounter (INDEPENDENT_AMBULATORY_CARE_PROVIDER_SITE_OTHER): Payer: Commercial Managed Care - HMO | Admitting: Ophthalmology

## 2014-08-15 DIAGNOSIS — H43813 Vitreous degeneration, bilateral: Secondary | ICD-10-CM

## 2014-08-15 DIAGNOSIS — H35033 Hypertensive retinopathy, bilateral: Secondary | ICD-10-CM

## 2014-08-15 DIAGNOSIS — D3131 Benign neoplasm of right choroid: Secondary | ICD-10-CM

## 2014-08-15 DIAGNOSIS — H3532 Exudative age-related macular degeneration: Secondary | ICD-10-CM

## 2014-10-11 ENCOUNTER — Encounter (INDEPENDENT_AMBULATORY_CARE_PROVIDER_SITE_OTHER): Payer: Commercial Managed Care - HMO | Admitting: Ophthalmology

## 2014-10-11 DIAGNOSIS — I10 Essential (primary) hypertension: Secondary | ICD-10-CM

## 2014-10-11 DIAGNOSIS — H35033 Hypertensive retinopathy, bilateral: Secondary | ICD-10-CM

## 2014-10-11 DIAGNOSIS — H43813 Vitreous degeneration, bilateral: Secondary | ICD-10-CM

## 2014-10-11 DIAGNOSIS — H3532 Exudative age-related macular degeneration: Secondary | ICD-10-CM

## 2014-10-11 DIAGNOSIS — D3131 Benign neoplasm of right choroid: Secondary | ICD-10-CM

## 2014-11-14 ENCOUNTER — Encounter (INDEPENDENT_AMBULATORY_CARE_PROVIDER_SITE_OTHER): Payer: Commercial Managed Care - HMO | Admitting: Ophthalmology

## 2014-11-14 DIAGNOSIS — H35033 Hypertensive retinopathy, bilateral: Secondary | ICD-10-CM

## 2014-11-14 DIAGNOSIS — I10 Essential (primary) hypertension: Secondary | ICD-10-CM

## 2014-11-14 DIAGNOSIS — H43813 Vitreous degeneration, bilateral: Secondary | ICD-10-CM

## 2014-11-14 DIAGNOSIS — H3532 Exudative age-related macular degeneration: Secondary | ICD-10-CM

## 2014-12-26 ENCOUNTER — Encounter (INDEPENDENT_AMBULATORY_CARE_PROVIDER_SITE_OTHER): Payer: Commercial Managed Care - HMO | Admitting: Ophthalmology

## 2014-12-26 DIAGNOSIS — H3532 Exudative age-related macular degeneration: Secondary | ICD-10-CM

## 2014-12-26 DIAGNOSIS — H43813 Vitreous degeneration, bilateral: Secondary | ICD-10-CM

## 2014-12-26 DIAGNOSIS — H35033 Hypertensive retinopathy, bilateral: Secondary | ICD-10-CM

## 2014-12-26 DIAGNOSIS — I10 Essential (primary) hypertension: Secondary | ICD-10-CM

## 2014-12-26 DIAGNOSIS — D3131 Benign neoplasm of right choroid: Secondary | ICD-10-CM

## 2015-02-20 ENCOUNTER — Encounter (INDEPENDENT_AMBULATORY_CARE_PROVIDER_SITE_OTHER): Payer: Commercial Managed Care - HMO | Admitting: Ophthalmology

## 2015-02-20 DIAGNOSIS — I10 Essential (primary) hypertension: Secondary | ICD-10-CM | POA: Diagnosis not present

## 2015-02-20 DIAGNOSIS — H3531 Nonexudative age-related macular degeneration: Secondary | ICD-10-CM | POA: Diagnosis not present

## 2015-02-20 DIAGNOSIS — H35033 Hypertensive retinopathy, bilateral: Secondary | ICD-10-CM | POA: Diagnosis not present

## 2015-02-20 DIAGNOSIS — D3131 Benign neoplasm of right choroid: Secondary | ICD-10-CM | POA: Diagnosis not present

## 2015-02-20 DIAGNOSIS — H43813 Vitreous degeneration, bilateral: Secondary | ICD-10-CM | POA: Diagnosis not present

## 2015-02-20 DIAGNOSIS — H3532 Exudative age-related macular degeneration: Secondary | ICD-10-CM

## 2015-04-24 ENCOUNTER — Encounter (INDEPENDENT_AMBULATORY_CARE_PROVIDER_SITE_OTHER): Payer: Commercial Managed Care - HMO | Admitting: Ophthalmology

## 2015-04-24 DIAGNOSIS — H3532 Exudative age-related macular degeneration: Secondary | ICD-10-CM | POA: Diagnosis not present

## 2015-04-24 DIAGNOSIS — H35033 Hypertensive retinopathy, bilateral: Secondary | ICD-10-CM | POA: Diagnosis not present

## 2015-04-24 DIAGNOSIS — H3531 Nonexudative age-related macular degeneration: Secondary | ICD-10-CM

## 2015-04-24 DIAGNOSIS — I10 Essential (primary) hypertension: Secondary | ICD-10-CM | POA: Diagnosis not present

## 2015-04-24 DIAGNOSIS — H43813 Vitreous degeneration, bilateral: Secondary | ICD-10-CM | POA: Diagnosis not present

## 2015-06-26 ENCOUNTER — Encounter (INDEPENDENT_AMBULATORY_CARE_PROVIDER_SITE_OTHER): Payer: Commercial Managed Care - HMO | Admitting: Ophthalmology

## 2015-06-26 DIAGNOSIS — H3531 Nonexudative age-related macular degeneration: Secondary | ICD-10-CM | POA: Diagnosis not present

## 2015-06-26 DIAGNOSIS — H35033 Hypertensive retinopathy, bilateral: Secondary | ICD-10-CM | POA: Diagnosis not present

## 2015-06-26 DIAGNOSIS — D3131 Benign neoplasm of right choroid: Secondary | ICD-10-CM

## 2015-06-26 DIAGNOSIS — I10 Essential (primary) hypertension: Secondary | ICD-10-CM

## 2015-06-26 DIAGNOSIS — H43813 Vitreous degeneration, bilateral: Secondary | ICD-10-CM

## 2015-06-26 DIAGNOSIS — H3532 Exudative age-related macular degeneration: Secondary | ICD-10-CM | POA: Diagnosis not present

## 2015-08-14 ENCOUNTER — Encounter (INDEPENDENT_AMBULATORY_CARE_PROVIDER_SITE_OTHER): Payer: Commercial Managed Care - HMO | Admitting: Ophthalmology

## 2015-08-14 DIAGNOSIS — H353231 Exudative age-related macular degeneration, bilateral, with active choroidal neovascularization: Secondary | ICD-10-CM

## 2015-08-14 DIAGNOSIS — D3131 Benign neoplasm of right choroid: Secondary | ICD-10-CM

## 2015-08-14 DIAGNOSIS — I10 Essential (primary) hypertension: Secondary | ICD-10-CM | POA: Diagnosis not present

## 2015-08-14 DIAGNOSIS — H43813 Vitreous degeneration, bilateral: Secondary | ICD-10-CM

## 2015-08-14 DIAGNOSIS — H35033 Hypertensive retinopathy, bilateral: Secondary | ICD-10-CM | POA: Diagnosis not present

## 2015-10-03 DEATH — deceased

## 2015-10-09 ENCOUNTER — Encounter (INDEPENDENT_AMBULATORY_CARE_PROVIDER_SITE_OTHER): Payer: Commercial Managed Care - HMO | Admitting: Ophthalmology
